# Patient Record
Sex: Female | Born: 1990 | Race: White | Hispanic: No | Marital: Single | Smoking: Never smoker
Health system: Southern US, Community
[De-identification: ages and names within clinical notes are randomized; demographics above are authoritative.]

## PROBLEM LIST (undated history)

## (undated) DIAGNOSIS — R413 Other amnesia: Secondary | ICD-10-CM

## (undated) DIAGNOSIS — G479 Sleep disorder, unspecified: Secondary | ICD-10-CM

## (undated) DIAGNOSIS — R5383 Other fatigue: Secondary | ICD-10-CM

## (undated) DIAGNOSIS — F419 Anxiety disorder, unspecified: Secondary | ICD-10-CM

## (undated) DIAGNOSIS — F909 Attention-deficit hyperactivity disorder, unspecified type: Secondary | ICD-10-CM

## (undated) DIAGNOSIS — K589 Irritable bowel syndrome without diarrhea: Secondary | ICD-10-CM

## (undated) HISTORY — DX: Attention-deficit hyperactivity disorder, unspecified type: F90.9

## (undated) HISTORY — DX: Irritable bowel syndrome, unspecified: K58.9

## (undated) HISTORY — PX: ARM WOUND REPAIR / CLOSURE: SUR1141

## (undated) HISTORY — DX: Other amnesia: R41.3

## (undated) HISTORY — DX: Sleep disorder, unspecified: G47.9

## (undated) HISTORY — DX: Anxiety disorder, unspecified: F41.9

## (undated) HISTORY — DX: Other fatigue: R53.83

---
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
## 2011-06-08 ENCOUNTER — Encounter: Payer: Self-pay | Admitting: Pulmonary Disease

## 2011-06-09 ENCOUNTER — Encounter: Payer: Self-pay | Admitting: Pulmonary Disease

## 2011-06-09 ENCOUNTER — Ambulatory Visit (INDEPENDENT_AMBULATORY_CARE_PROVIDER_SITE_OTHER): Payer: BC Managed Care – PPO | Admitting: Pulmonary Disease

## 2011-06-09 DIAGNOSIS — G4710 Hypersomnia, unspecified: Secondary | ICD-10-CM

## 2011-06-09 DIAGNOSIS — F518 Other sleep disorders not due to a substance or known physiological condition: Secondary | ICD-10-CM

## 2011-06-09 DIAGNOSIS — Z72821 Inadequate sleep hygiene: Secondary | ICD-10-CM
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
## 2011-06-09 NOTE — Patient Instructions (Addendum)
Will schedule for nighttime and daytime sleep study to evaluate for a sleep disorder. You will need to stop lexapro and adderall 2 weeks prior to your sleep study You will need to fill out sleep logs for 2 weeks leading up to your sleep study. Try and start your day no later than 10am if possible, and avoid napping as much as you can  Will arrange followup once your sleep study is done.
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
## 2011-06-09 NOTE — Assessment & Plan Note (Signed)
The patient has very poor sleep hygiene by smoking marijuana before bedtime, also consuming unknown quantities of alcohol, sleeping until the afternoon, and also taking caffeine pills and Adderall during the day to stay awake.  I have reviewed good sleep hygiene with Nicole Decker, and have asked Nicole Decker to try and stay away from recreational drugs as much as possible.

## 2011-06-18 ENCOUNTER — Telehealth: Payer: Self-pay | Admitting: Pulmonary Disease

## 2011-06-18 NOTE — Telephone Encounter (Signed)
I spoke with pt and she states she has her anti anxiety medication and her adderall and as a side effect it has been making her really sleepy and is tried all the time. Pt states she has been trying everything that Dr. Shelle Iron recommended to her. Pt is not wanting to have the sleep study done since she can't comply with all of KC recs from her OV on 06/09/11. Pt is scheduled for 2 sleep studies 1 on 06/30/11 and the second on 07/01/11. Please advise Dr. Shelle Iron, thanks

## 2011-06-18 NOTE — Telephone Encounter (Signed)
I spoke with pt and made her aware of KC recs. She states she has stopped her medications as KC requested and is going to have the sleep study done. Pt states she is doing her best trying to get on a sleep schedule but is just tired all the time. Pt needed nothing further

## 2011-06-18 NOTE — Telephone Encounter (Signed)
Let her know that I suspect her sleep habits and lifestyle are the primary issues here, but I cannot exclude narcolepsy.  She really needs to have the sleep studies done, and has to be off her antidepressant and adderall to do them.  I don't know what else to say, except she has to work on her lifestyle changes we discussed and need to try and stay off those medications to have the sleep studies done.

## 2011-06-30 ENCOUNTER — Ambulatory Visit (HOSPITAL_BASED_OUTPATIENT_CLINIC_OR_DEPARTMENT_OTHER): Payer: BC Managed Care – PPO | Attending: Pulmonary Disease | Admitting: Radiology

## 2011-06-30 VITALS — Ht 67.0 in | Wt 125.0 lb

## 2011-06-30 DIAGNOSIS — G4710 Hypersomnia, unspecified: Secondary | ICD-10-CM | POA: Insufficient documentation

## 2011-06-30 DIAGNOSIS — G4733 Obstructive sleep apnea (adult) (pediatric): Secondary | ICD-10-CM

## 2011-06-30 DIAGNOSIS — Z79899 Other long term (current) drug therapy: Secondary | ICD-10-CM | POA: Insufficient documentation

## 2011-06-30 DIAGNOSIS — G479 Sleep disorder, unspecified: Secondary | ICD-10-CM | POA: Insufficient documentation

## 2011-07-01 ENCOUNTER — Ambulatory Visit (HOSPITAL_BASED_OUTPATIENT_CLINIC_OR_DEPARTMENT_OTHER): Payer: BC Managed Care – PPO | Attending: Pulmonary Disease | Admitting: Radiology

## 2011-07-01 DIAGNOSIS — G479 Sleep disorder, unspecified: Secondary | ICD-10-CM

## 2011-07-01 DIAGNOSIS — G4710 Hypersomnia, unspecified: Secondary | ICD-10-CM

## 2011-07-12 DIAGNOSIS — G479 Sleep disorder, unspecified: Secondary | ICD-10-CM

## 2011-07-13 NOTE — Procedures (Signed)
NAMEKIMBER, Nicole Decker               ACCOUNT NO.:  [id number]  MEDICAL RECORD NO.:  [id number]          PATIENT TYPE:  OUT  LOCATION:  SLEEP CENTER                 FACILITY:  [facility]  PHYSICIAN:  Barbaraann Share, MD,FCCPDATE OF BIRTH:  12-01-89  DATE OF STUDY:  07/01/2011                         MULTIPLE SLEEP LATENCY TEST  REFERRING PHYSICIAN:  Barbaraann Share, MD,FCCP  INDICATION FOR STUDY:  Other sleep disturbance.  EPWORTH SLEEPINESS SCORE:  19.  BMI:  MEDICATIONS:  NAP 1:  Sleep onset latency 17 minutes. REM onset latency N/A.  NAP 2:  Sleep onset latency 15.5 minutes. REM onset N/A.  NAP 3:  Sleep onset latency 6.0 minutes. REM onset latency 11.0 minutes.  NAP 4:  Sleep onset latency 13.0 minutes. REM onset latency N/A.  NAP 5: 1. Sleep onset latency 1.5 minutes. REM onset latency N/A.   MEAN SLEEP LATENCY:  10.6 minutes.  NUMBER OF REM EPISODES:  1  COMMENTS: 1. The patient was asked to discontinue her antidepressant medications     2 weeks prior to this study. Her sleep logs indicate that she was     not taking this medication. 2. Sleep logs 2 weeks prior to the study show adequate sleep quantity     with reasonable sleep hygiene other then sleeping in to the late     morning, and taking naps during the afternoons. 3. NPSG the night before her MSLT shows no sleep-disordered breathing,     or abnormality that could lead to daytime sleepiness.  IMPRESSIONS-RECOMMENDATIONS:  MSLT shows borderline objective daytime sleepiness with a mean sleep latency of 10.6 minutes.  The patient only had 1 sleep onset REM period, and therefore unlikely is consistent with narcolepsy.  More than likely her sleepiness issue is related to her sleep hygiene, however, I cannot exclude the possibility of idiopathic daytime hypersomnia.  Clinical correlation is suggested.     Barbaraann Share, MD,FCCP Diplomate, American Board of Sleep Medicine Electronically  Signed    [facility]  D:  07/12/2011 15:37:57  T:  07/13/2011 00:12:45  Job:  [id number]

## 2011-07-13 NOTE — Procedures (Signed)
NAME:  Nicole Decker, Nicole Decker               ACCOUNT NO.:  [id number]  MEDICAL RECORD NO.:  [id number]          PATIENT TYPE:  OUT  LOCATION:  SLEEP CENTER                 FACILITY:  [facility]  PHYSICIAN:  Barbaraann Share, MD,FCCPDATE OF BIRTH:  1990-10-[birth year]  DATE OF STUDY:  06/30/2011                           NOCTURNAL POLYSOMNOGRAM  REFERRING PHYSICIAN:  Barbaraann Share, MD,FCCP  LOCATION:  Sleep Lab.  REFERRING PHYSICIAN:  Barbaraann Share, MD, FCCP  INDICATION FOR STUDY:  Other sleep disturbance.  EPWORTH SLEEPINESS SCORE:  19.  MEDICATIONS:  SLEEP ARCHITECTURE:  The patient had a total sleep time of 291 minutes with very little slow-wave sleep and only 77 minutes of REM.  Sleep onset latency was prolonged at 149 minutes and REM onset was fairly rapid at 43 minutes.  Sleep efficiency was poor at 65%.  RESPIRATORY DATA:  The patient was found to have 3 apneas and only 1 obstructive hypopnea, giving her an apnea-hypopnea index of 0.8 events per hour.  The events only occurred in the supine position and there was mild snoring noted throughout.  OXYGEN DATA:  There was O2 desaturation as low as 94% with the patient's obstructive events.  CARDIAC DATA:  No clinically significant arrhythmias were noted.  MOVEMENT-PARASOMNIA:  The patient was found to have no significant periodic limb movements, and no behavioral abnormalities.  There was also no abnormal EEG activity.  IMPRESSIONS-RECOMMENDATIONS:  Small numbers of obstructive events, which do not meet the AHI criteria for the obstructive sleep apnea syndrome. If the patient has significant daytime sleepiness that remains unexplained by this study, clinical correlation is suggested.     Barbaraann Share, MD,FCCP Diplomate, American Board of Sleep Medicine Electronically Signed    [facility]  D:  07/12/2011 15:04:59  T:  07/13/2011 00:03:09  Job:  [id number]

## 2011-07-15 ENCOUNTER — Telehealth: Payer: Self-pay | Admitting: *Deleted

## 2011-07-15 ENCOUNTER — Telehealth: Payer: Self-pay | Admitting: Pulmonary Disease

## 2011-07-15 NOTE — Telephone Encounter (Signed)
Per KC, pt needs ov with him to discuss sleep study results. ATC pt at home # (which is also her cell#) NA and No option to leave message.  WCB.

## 2011-07-15 NOTE — Telephone Encounter (Signed)
Pt scheduled for follow up with [facility] 07/24/11 ok per Megan R.

## 2011-07-15 NOTE — Telephone Encounter (Signed)
Pt scheduled 07/24/11 @ 4:30 with KC. Okay per Aundra Millet, pt aware.

## 2011-07-24 ENCOUNTER — Ambulatory Visit (INDEPENDENT_AMBULATORY_CARE_PROVIDER_SITE_OTHER): Payer: BC Managed Care – PPO | Admitting: Pulmonary Disease

## 2011-07-24 ENCOUNTER — Encounter: Payer: Self-pay | Admitting: Pulmonary Disease

## 2011-07-24 ENCOUNTER — Encounter: Payer: Self-pay | Admitting: *Deleted

## 2011-07-24 DIAGNOSIS — F518 Other sleep disorders not due to a substance or known physiological condition: Secondary | ICD-10-CM

## 2011-07-24 DIAGNOSIS — Z72821 Inadequate sleep hygiene: Secondary | ICD-10-CM

## 2011-07-24 DIAGNOSIS — G4710 Hypersomnia, unspecified: Secondary | ICD-10-CM

## 2011-07-24 MED ORDER — TRAZODONE HCL 50 MG PO TABS: 50.0000 mg | ORAL_TABLET | Freq: Every day | ORAL | Status: AC

## 2011-07-24 NOTE — Patient Instructions (Signed)
Will try you on trazodone 50mg  about one hour before bedtime each night Avoid smoking marijuana for the next 4 weeks Try and get up each am between 8-10am, and therefore in bed by 2am at latest. No napping during day if you can avoid. Please call me in 3-4 weeks with how things are going and we can discuss further.

## 2011-07-24 NOTE — Assessment & Plan Note (Signed)
The patient's sleep evaluation shows no evidence for a nocturnal sleep disorder, and is not consistent with narcolepsy.  I cannot totally exclude the possibility of idiopathic hypersomnia, but her mean sleep latency is much higher than expected for that particular disease process.  I suspect the majority of this is due to her hectic lifestyle and poor sleep hygiene.  Also complicating this is her recreational drug use.  She is trying to go to school and work, continues to sleep into the late morning and early afternoon, still takes naps, is taking Adderall as needed to stay awake during the day, and smokes marijuana nightly before going to bed.  She is still having frequent awakenings during the night of unknown origin, and anxiety may be playing a role in this.  This of course leads to sleep fragmentation and nonrestorative sleep.  I would like to try her on trazodone at bedtime to consolidate her sleep as much is possible, and I have asked her to stop smoking marijuana at bedtime until we can sort through all of this.  She is not sure that she is willing to do this.  I have also asked her to work on her sleep hygiene, and to try and start her day earlier in the mornings and avoid napping.

## 2011-07-24 NOTE — Progress Notes (Signed)
  Subjective:    Patient ID: Nicole Decker, female    DOB: [birth year], 21 y.o.   MRN: [id number]  HPI The patient comes in today for followup after her recent sleep evaluation.  She underwent nocturnal polysomnography, and had no evidence for sleep-disordered breathing, arrhythmias, movement disorder of sleep, parasomnias, or seizures.  She then underwent a multiple sleep latency test which showed a mean sleep latency of greater than 10 minutes, and only one REM period noted throughout the naps.  I have reviewed the study with her in detail, and answered all of her questions.   Review of Systems  Constitutional: Negative for fever and unexpected weight change.  HENT: Negative for ear pain, nosebleeds, congestion, sore throat, rhinorrhea, sneezing, trouble swallowing, dental problem, postnasal drip and sinus pressure.   Eyes: Negative for redness and itching.  Respiratory: Negative for cough, chest tightness, shortness of breath and wheezing.   Cardiovascular: Negative for palpitations and leg swelling.  Gastrointestinal: Negative for nausea and vomiting.  Genitourinary: Negative for dysuria.  Musculoskeletal: Negative for joint swelling.  Skin: Negative for rash.  Neurological: Negative for headaches.  Hematological: Does not bruise/bleed easily.  Psychiatric/Behavioral: Negative for dysphoric mood. The patient is not nervous/anxious.        Objective:   Physical Exam Well-developed female in no acute distress Nose without purulence or discharge noted Lower extremities without edema, no cyanosis Alert and oriented, moves all 4 extremities.  Does not appear to be overly sleepy.       Assessment & Plan:

## 2011-09-19 ENCOUNTER — Other Ambulatory Visit: Payer: Self-pay | Admitting: Pulmonary Disease

## 2011-12-08 ENCOUNTER — Other Ambulatory Visit: Payer: Self-pay | Admitting: Pulmonary Disease

## 2012-01-04 ENCOUNTER — Telehealth: Payer: Self-pay | Admitting: Pulmonary Disease
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
## 2012-01-04 NOTE — Telephone Encounter (Signed)
Let her know that a letter would not be very helpful to her.  The reason for her sleepiness is her own lifestyle and habits, and I am not sure that would sit well with the judge.  Remember, the sleep studies did not show sleep apnea, narcolepsy, or any other sleep disorder.   If she needs something for evaluation documenting that she did have an evaluation for sleepiness, can get my last note to give to the judge.  However, I don't think would be very smart on her part because it does not document a sleep disorder.  Would read and think about before doing this.

## 2012-01-05 NOTE — Telephone Encounter (Signed)
Pt advised an OV not mailed to the pt. Nicole Decker, CMA

## 2012-07-13 HISTORY — PX: ESOPHAGOGASTRODUODENOSCOPY: SHX1529

## 2012-07-13 HISTORY — PX: COLONOSCOPY: SHX174

## 2012-09-20 ENCOUNTER — Encounter: Payer: Self-pay | Admitting: Gastroenterology

## 2012-10-04 ENCOUNTER — Encounter: Payer: Self-pay | Admitting: Gastroenterology

## 2012-10-04 ENCOUNTER — Ambulatory Visit (INDEPENDENT_AMBULATORY_CARE_PROVIDER_SITE_OTHER): Payer: BC Managed Care – PPO | Admitting: Gastroenterology

## 2012-10-04 ENCOUNTER — Telehealth: Payer: Self-pay | Admitting: *Deleted

## 2012-10-04 ENCOUNTER — Other Ambulatory Visit (INDEPENDENT_AMBULATORY_CARE_PROVIDER_SITE_OTHER): Payer: BC Managed Care – PPO

## 2012-10-04 VITALS — BP 94/60 | HR 94 | Ht 65.5 in | Wt 113.5 lb

## 2012-10-04 DIAGNOSIS — K589 Irritable bowel syndrome without diarrhea: Secondary | ICD-10-CM

## 2012-10-04 DIAGNOSIS — K219 Gastro-esophageal reflux disease without esophagitis: Secondary | ICD-10-CM

## 2012-10-04 DIAGNOSIS — R110 Nausea: Secondary | ICD-10-CM

## 2012-10-04 DIAGNOSIS — R1011 Right upper quadrant pain: Secondary | ICD-10-CM

## 2012-10-04 LAB — HEPATIC FUNCTION PANEL
Alkaline Phosphatase: 29 U/L — ABNORMAL LOW (ref 39–117)
Bilirubin, Direct: 0.1 mg/dL (ref 0.0–0.3)
Total Bilirubin: 0.5 mg/dL (ref 0.3–1.2)

## 2012-10-04 LAB — TSH: TSH: 3 u[IU]/mL (ref 0.35–5.50)

## 2012-10-04 LAB — COMPREHENSIVE METABOLIC PANEL
Albumin: 3.9 g/dL (ref 3.5–5.2)
Alkaline Phosphatase: 29 U/L — ABNORMAL LOW (ref 39–117)
BUN: 12 mg/dL (ref 6–23)
CO2: 27 mEq/L (ref 19–32)
GFR: 91.72 mL/min (ref >60.00)
Glucose, Bld: 117 mg/dL — ABNORMAL HIGH (ref 70–99)
Potassium: 4.2 mEq/L (ref 3.5–5.1)
Sodium: 140 mEq/L (ref 135–145)
Total Protein: 7.2 g/dL (ref 6.0–8.3)

## 2012-10-04 LAB — CBC WITH DIFFERENTIAL/PLATELET
Basophils Relative: 0.2 % (ref 0.0–3.0)
Eosinophils Relative: 2.5 % (ref 0.0–5.0)
HCT: 42.7 % (ref 36.0–46.0)
Lymphs Abs: 2.1 10*3/uL (ref 0.7–4.0)
MCV: 95.7 fl (ref 78.0–100.0)
Monocytes Absolute: 0.4 10*3/uL (ref 0.1–1.0)
Monocytes Relative: 6.8 % (ref 3.0–12.0)
Neutrophils Relative %: 55.5 % (ref 43.0–77.0)
RBC: 4.46 Mil/uL (ref 3.87–5.11)
WBC: 6.1 10*3/uL (ref 4.5–10.5)

## 2012-10-04 LAB — IBC PANEL: Saturation Ratios: 14 % — ABNORMAL LOW (ref 20.0–50.0)

## 2012-10-04 LAB — FOLATE: Folate: 13.9 ng/mL (ref >5.9)

## 2012-10-04 LAB — FERRITIN: Ferritin: 42.9 ng/mL (ref 10.0–291.0)

## 2012-10-04 MED ORDER — CILIDINIUM-CHLORDIAZEPOXIDE 2.5-5 MG PO CAPS: 1.0000 | ORAL_CAPSULE | Freq: Three times a day (TID) | ORAL | Status: DC | PRN

## 2012-10-04 MED ORDER — CELECOXIB 200 MG PO CAPS: 200.0000 mg | ORAL_CAPSULE | Freq: Two times a day (BID) | ORAL | Status: DC

## 2012-10-04 MED ORDER — PANTOPRAZOLE SODIUM 40 MG PO TBEC: 40.0000 mg | DELAYED_RELEASE_TABLET | Freq: Every day | ORAL | Status: DC

## 2012-10-04 NOTE — Patient Instructions (Addendum)
It has been recommended to you by your physician that you have a(n) colonoscopy and endoscopy completed. Per your request, we did not schedule the procedure(s) today. Please contact our office at [phone] should you decide to have the procedure completed.  You have been scheduled for an abdominal ultrasound at [facility] Radiology (1st floor of hospital) on 10-06-2012 at 8 AM. Please arrive 15 minutes prior to your appointment for registration. Make certain not to have anything to eat or drink 6 hours prior to your appointment. Should you need to reschedule your appointment, please contact radiology at [phone]. This test typically takes about 30 minutes to perform.   Your physician has requested that you go to the basement for lab work before leaving today.   Celebrex 200 mg was sent to your pharmacy, please take twice daily.   Librax was sent to your pharmacy, please take as directed.  Protonix was refilled.   Please follow FOD-Map diet given today. ____________________________________________________________________________________________________________________  Irritable Bowel Syndrome Irritable Bowel Syndrome (IBS) is caused by a disturbance of normal bowel function. Other terms used are spastic colon, mucous colitis, and irritable colon. It does not require surgery, nor does it lead to cancer. There is no cure for IBS. But with proper diet, stress reduction, and medication, you will find that your problems (symptoms) will gradually disappear or improve. IBS is a common digestive disorder. It usually appears in late adolescence or early adulthood. Women develop it twice as often as men. CAUSES  After food has been digested and absorbed in the small intestine, waste material is moved into the colon (large intestine). In the colon, water and salts are absorbed from the undigested products coming from the small intestine. The remaining residue, or fecal material, is held for  elimination. Under normal circumstances, gentle, rhythmic contractions on the bowel walls push the fecal material along the colon towards the rectum. In IBS, however, these contractions are irregular and poorly coordinated. The fecal material is either retained too long, resulting in constipation, or expelled too soon, producing diarrhea. SYMPTOMS  The most common symptom of IBS is pain. It is typically in the lower left side of the belly (abdomen). But it may occur anywhere in the abdomen. It can be felt as heartburn, backache, or even as a dull pain in the arms or shoulders. The pain comes from excessive bowel-muscle spasms and from the buildup of gas and fecal material in the colon. This pain:  Can range from sharp belly (abdominal) cramps to a dull, continuous ache.  Usually worsens soon after eating.  Is typically relieved by having a bowel movement or passing gas. Abdominal pain is usually accompanied by constipation. But it may also produce diarrhea. The diarrhea typically occurs right after a meal or upon arising in the morning. The stools are typically soft and watery. They are often flecked with secretions (mucus). Other symptoms of IBS include:  Bloating.  Loss of appetite.  Heartburn.  Feeling sick to your stomach (nausea).  Belching  Vomiting  Gas. IBS may also cause a number of symptoms that are unrelated to the digestive system:  Fatigue.  Headaches.  Anxiety  Shortness of breath  Difficulty in concentrating.  Dizziness. These symptoms tend to come and go. DIAGNOSIS  The symptoms of IBS closely mimic the symptoms of other, more serious digestive disorders. So your caregiver may wish to perform a variety of additional tests to exclude these disorders. He/she wants to be certain of learning what is wrong (diagnosis).  The nature and purpose of each test will be explained to you. TREATMENT A number of medications are available to help correct bowel function and/or  relieve bowel spasms and abdominal pain. Among the drugs available are:  Mild, non-irritating laxatives for severe constipation and to help restore normal bowel habits.  Specific anti-diarrheal medications to treat severe or prolonged diarrhea.  Anti-spasmodic agents to relieve intestinal cramps.  Your caregiver may also decide to treat you with a mild tranquilizer or sedative during unusually stressful periods in your life. The important thing to remember is that if any drug is prescribed for you, make sure that you take it exactly as directed. Make sure that your caregiver knows how well it worked for you. HOME CARE INSTRUCTIONS   Avoid foods that are high in fat or oils. Some examples WUJ:WJXBJ cream, butter, frankfurters, sausage, and other fatty meats.  Avoid foods that have a laxative effect, such as fruit, fruit juice, and dairy products.  Cut out carbonated drinks, chewing gum, and "gassy" foods, such as beans and cabbage. This may help relieve bloating and belching.  Bran taken with plenty of liquids may help relieve constipation.  Keep track of what foods seem to trigger your symptoms.  Avoid emotionally charged situations or circumstances that produce anxiety.  Start or continue exercising.  Get plenty of rest and sleep. MAKE SURE YOU:   Understand these instructions.  Will watch your condition.  Will get help right away if you are not doing well or get worse. Document Released: 06/29/2005 Document Revised: 09/21/2011 Document Reviewed: 02/17/2008 [facility] Patient Information 2013 [place], [place].   Information below on Irritable Bowel Syndrome. __________________________________________________________________________________                                               We are excited to introduce MyChart, a new best-in-class service that provides you online access to important information in your electronic medical record. We want to make it easier for you to  view your health information - all in one secure location - when and where you need it. We expect MyChart will enhance the quality of care and service we provide.  When you register for MyChart, you can:    View your test results.    Request appointments and receive appointment reminders via email.    Request medication renewals.    View your medical history, allergies, medications and immunizations.    Communicate with your physician's office through a password-protected site.    Conveniently print information such as your medication lists.  To find out if MyChart is right for you, please talk to a member of our clinical staff today. We will gladly answer your questions about this free health and wellness tool.  If you are age 69 or older and want a member of your family to have access to your record, you must provide written consent by completing a proxy form available at our office. Please speak to our clinical staff about guidelines regarding accounts for patients younger than age 46.  As you activate your MyChart account and need any technical assistance, please call the MyChart technical support line at [phone]) or email your question to [email or web address]. If you email your question(s), please include your name, a return phone number and the best time to reach you.  If you have non-urgent health-related questions, you  can send a message to our office through MyChart at [email or web address]. If you have a medical emergency, call 911.  Thank you for using MyChart as your new health and wellness resource!   MyChart licensed from [employer],  1610-9604. Patents Pending.

## 2012-10-04 NOTE — Progress Notes (Signed)
History of Present Illness:  This is a 22 year old Caucasian female with a long history of IBS going back to childhood with chronic dyspepsia, alternating diarrhea and constipation, crampy abdominal pain, dizziness, chronic depression, chronic gas and bloating, and a history of periodic flushing with stress , but no skin rashes, cardiac palpitations, or asthma..  She been on antidepressants and PPI medications without improvement.  She also takes Adderall 20 mg periodically for ADHD.  There is a  question of lactose intolerance, but the patient otherwise denies any specific food intolerances, and there is no history of celiac disease in her family.  The patient is accompanied by her mother for her exam and interview.  She complains mostly of lower abdominal cramps followed by very loose bowel movements .rectal bleeding or melena, but occasional bouts of extreme constipation.  She complains of early morning nausea ,but has been tested for pregnancy on several occasions and is not pregnant.  She also complains of extreme vertigo.  She has a lot of abdominal gas and bloating and periodic reflux symptoms but denies severe GERD, or dysphagia.  In the last month she's had a right upper quadrant musculoskeletal type pain without specific hepatobiliary complaints.Has not had previous GI evaluations such as endoscopic procedures or radiographs or specific GI blood work.  She specifically denies any significant anorexia or weight loss.  I have reviewed this patient's present history, medical and surgical past history, allergies and medications.     ROS:   All systems were reviewed and are negative unless otherwise stated in the HPI.  She does complain of severe insomnia but denies nay psychotic symptoms.  Recent gynecologic examination as recorded and was unremarkable.  The patient denies abuse of NSAIDs or cigarettes.    Physical Exam: At pressure 94 was 60, pulse 94 and regular, and weight 113 with a BMI of 18.59.   I cannot appreciate stigmata of chronic liver disease or any specific skin rashes or lymphadenopathy. General well developed well nourished patient in no acute distress, appearing their stated age Eyes PERRLA, no icterus, fundoscopic exam per opthamologist Skin no lesions noted Neck supple, no adenopathy, no thyroid enlargement, no tenderness Chest clear to percussion and auscultation... tenderness of the right anterior costal rib areas. Heart no significant murmurs, gallops or rubs noted Abdomen no hepatosplenomegaly masses or tenderness, BS normal there is no significant abdominal bloating or tenderness.  There is no succussion splash noted..  Extremities no acute joint lesions, edema, phlebitis or evidence of cellulitis. Neurologic patient oriented x 3, cranial nerves intact, no focal neurologic deficits noted. Psychological mental status normal and normal affect.  Assessment and plan: Rather classic IBS and 22 year old patient with a strong family history of IBS.  I placed her on a FOD-MAP diet for IBS, Librax twice a day, have scheduled for GI evaluation including colonoscopy and endoscopy.  I think her right upper quadrant pain is musculoskeletal  costochondritis, and I placed her on Celebrex 200 mg a day, but have ordered upper abdominal ultrasound exam at her request.  Standard labs also ordered for review.  There may be an element of lactose intolerance, and we will be sure to exclude celiac disease.  Patient readily admits distress causes most of her GI difficulties, and she may need psychological evaluation and referral.  She does have a family history of colon polyps.  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  She relates she  has not used alcohol in over one month.  Please send a copy this report to Julio Sicks, RN at [facility].  I also have asked patient to continue  her Protonix 40 mg a day for her GERD.

## 2012-10-04 NOTE — Telephone Encounter (Signed)
Patients Celebrex was approved.  Case number is [id number]  Pharmacy notified

## 2012-10-04 NOTE — Telephone Encounter (Signed)
Via fax from [facility] prior [facility] Harvey is needed for patients Celebrex.  I called [phone].   I spoke with Vickie at [employer].

## 2012-10-05 LAB — CELIAC PANEL 10
Endomysial Screen: NEGATIVE
Gliadin IgA: 3.7 U/mL (ref <20)
Gliadin IgG: 6.3 U/mL (ref <20)
IgA: 173 mg/dL (ref 69–380)
Tissue Transglut Ab: 5.3 U/mL (ref <20)
Tissue Transglutaminase Ab, IgA: 2.7 U/mL (ref <20)

## 2012-10-06 ENCOUNTER — Ambulatory Visit (HOSPITAL_COMMUNITY): Admission: RE | Admit: 2012-10-06 | Payer: BC Managed Care – PPO | Source: Ambulatory Visit

## 2012-10-06 ENCOUNTER — Telehealth: Payer: Self-pay | Admitting: *Deleted

## 2012-10-06 ENCOUNTER — Telehealth: Payer: Self-pay | Admitting: Gastroenterology

## 2012-10-06 DIAGNOSIS — R110 Nausea: Secondary | ICD-10-CM

## 2012-10-06 DIAGNOSIS — R1011 Right upper quadrant pain: Secondary | ICD-10-CM

## 2012-10-06 NOTE — Telephone Encounter (Signed)
Patient called back and said that she has colon/endo scheduled for 10-12-2012.  I told patient prep instructions for her Ultrasound(advised patient that its the same instructions as previous)  Patient verbalized understanding.

## 2012-10-06 NOTE — Telephone Encounter (Signed)
You have been scheduled for an abdominal ultrasound at [facility] Radiology (1st floor of hospital) on 11-02-2012 at 9:30 AM. Please arrive 15 minutes prior to your appointment for registration. Make certain not to have anything to eat or drink 6 hours prior to your appointment. Should you need to reschedule your appointment, please contact radiology at [phone]. This test typically takes about 30 minutes to perform. ______________________________________________________________________________________________________________________  Left message for patient to call back, no answer on cell phone.

## 2012-10-07 ENCOUNTER — Encounter: Payer: Self-pay | Admitting: Gastroenterology

## 2012-10-07 ENCOUNTER — Ambulatory Visit (AMBULATORY_SURGERY_CENTER): Payer: BC Managed Care – PPO | Admitting: *Deleted

## 2012-10-07 VITALS — Ht 65.0 in | Wt 111.6 lb

## 2012-10-07 DIAGNOSIS — K589 Irritable bowel syndrome without diarrhea: Secondary | ICD-10-CM

## 2012-10-07 DIAGNOSIS — K219 Gastro-esophageal reflux disease without esophagitis: Secondary | ICD-10-CM

## 2012-10-07 DIAGNOSIS — R110 Nausea: Secondary | ICD-10-CM

## 2012-10-07 DIAGNOSIS — R1011 Right upper quadrant pain: Secondary | ICD-10-CM

## 2012-10-07 MED ORDER — MOVIPREP 100 G PO SOLR: ORAL | Status: DC

## 2012-10-12 ENCOUNTER — Encounter: Payer: BC Managed Care – PPO | Admitting: Gastroenterology

## 2012-10-12 ENCOUNTER — Ambulatory Visit (AMBULATORY_SURGERY_CENTER): Payer: BC Managed Care – PPO | Admitting: Gastroenterology

## 2012-10-12 ENCOUNTER — Encounter: Payer: Self-pay | Admitting: Gastroenterology

## 2012-10-12 VITALS — BP 103/76 | HR 78 | Temp 99.4°F | Resp 21 | Ht 65.0 in | Wt 111.0 lb

## 2012-10-12 DIAGNOSIS — K589 Irritable bowel syndrome without diarrhea: Secondary | ICD-10-CM

## 2012-10-12 DIAGNOSIS — R110 Nausea: Secondary | ICD-10-CM

## 2012-10-12 DIAGNOSIS — K319 Disease of stomach and duodenum, unspecified: Secondary | ICD-10-CM

## 2012-10-12 DIAGNOSIS — D1330 Benign neoplasm of unspecified part of small intestine: Secondary | ICD-10-CM

## 2012-10-12 DIAGNOSIS — R1011 Right upper quadrant pain: Secondary | ICD-10-CM

## 2012-10-12 DIAGNOSIS — K3189 Other diseases of stomach and duodenum: Secondary | ICD-10-CM

## 2012-10-12 DIAGNOSIS — R1013 Epigastric pain: Secondary | ICD-10-CM

## 2012-10-12 MED ORDER — SODIUM CHLORIDE 0.9 % IV SOLN: 500.0000 mL | INTRAVENOUS | Status: DC

## 2012-10-12 NOTE — Progress Notes (Signed)
NO EGG OR SOY ALLERGY. EWM 

## 2012-10-12 NOTE — Progress Notes (Signed)
Called to room to assist during endoscopic procedure.  Patient ID and intended procedure confirmed with present staff. Received instructions for my participation in the procedure from the performing physician.  

## 2012-10-12 NOTE — Patient Instructions (Addendum)
Discharge instructions given with verbal understanding. Biopsies taken. Resume previous medications. YOU HAD AN ENDOSCOPIC PROCEDURE TODAY AT THE [facility]: Refer to the procedure report that was given to you for any specific questions about what was found during the examination.  If the procedure report does not answer your questions, please call your gastroenterologist to clarify.  If you requested that your care partner not be given the details of your procedure findings, then the procedure report has been included in a sealed envelope for you to review at your convenience later.  YOU SHOULD EXPECT: Some feelings of bloating in the abdomen. Passage of more gas than usual.  Walking can help get rid of the air that was put into your GI tract during the procedure and reduce the bloating. If you had a lower endoscopy (such as a colonoscopy or flexible sigmoidoscopy) you may notice spotting of blood in your stool or on the toilet paper. If you underwent a bowel prep for your procedure, then you may not have a normal bowel movement for a few days.  DIET: Your first meal following the procedure should be a light meal and then it is ok to progress to your normal diet.  A half-sandwich or bowl of soup is an example of a good first meal.  Heavy or fried foods are harder to digest and may make you feel nauseous or bloated.  Likewise meals heavy in dairy and vegetables can cause extra gas to form and this can also increase the bloating.  Drink plenty of fluids but you should avoid alcoholic beverages for 24 hours.  ACTIVITY: Your care partner should take you home directly after the procedure.  You should plan to take it easy, moving slowly for the rest of the day.  You can resume normal activity the day after the procedure however you should NOT DRIVE or use heavy machinery for 24 hours (because of the sedation medicines used during the test).    SYMPTOMS TO REPORT IMMEDIATELY: A gastroenterologist  can be reached at any hour.  During normal business hours, 8:30 AM to 5:00 PM Monday through Friday, call [phone].  After hours and on weekends, please call the GI answering service at [phone] who will take a message and have the physician on call contact you.   Following lower endoscopy (colonoscopy or flexible sigmoidoscopy):  Excessive amounts of blood in the stool  Significant tenderness or worsening of abdominal pains  Swelling of the abdomen that is new, acute  Fever of 100F or higher  Following upper endoscopy (EGD)  Vomiting of blood or coffee ground material  New chest pain or pain under the shoulder blades  Painful or persistently difficult swallowing  New shortness of breath  Fever of 100F or higher  Black, tarry-looking stools  FOLLOW UP: If any biopsies were taken you will be contacted by phone or by letter within the next 1-3 weeks.  Call your gastroenterologist if you have not heard about the biopsies in 3 weeks.  Our staff will call the home number listed on your records the next business day following your procedure to check on you and address any questions or concerns that you may have at that time regarding the information given to you following your procedure. This is a courtesy call and so if there is no answer at the home number and we have not heard from you through the emergency physician on call, we will assume that you have returned to your   regular daily activities without incident.  SIGNATURES/CONFIDENTIALITY: You and/or your care partner have signed paperwork which will be entered into your electronic medical record.  These signatures attest to the fact that that the information above on your After Visit Summary has been reviewed and is understood.  Full responsibility of the confidentiality of this discharge information lies with you and/or your care-partner. 

## 2012-10-12 NOTE — Op Note (Signed)
 [facility] [address] [place], [address]   ENDOSCOPY PROCEDURE REPORT  PATIENT: Nicole Decker, Nicole Decker  MR#: [id number] BIRTHDATE: [birth year] , 22  yrs. old GENDER: Female ENDOSCOPIST:Haizley Cannella Hale Bogus, MD, [facility] REFERRED BY: Julio Sicks, [employer] PROCEDURE DATE:  10/12/2012 PROCEDURE:   EGD w/ biopsy and EGD w/ biopsy for H.pylori ASA CLASS:    Class II INDICATIONS: Dyspepsia. MEDICATION: There was residual sedation effect present from prior procedure and propofol (Diprivan) 150mg  IV TOPICAL ANESTHETIC:  DESCRIPTION OF PROCEDURE:   After the risks and benefits of the procedure were explained, informed consent was obtained.  The [facility] GIF-H180 [id number]  endoscope was introduced through the mouth  and advanced to the second portion of the duodenum .  The instrument was slowly withdrawn as the mucosa was fully examined.    The upper, middle and distal third of the esophagus were carefully inspected and no abnormalities were noted.  The z-line was well seen at the GEJ.  The endoscope was pushed into the fundus which was normal including a retroflexed view.  The antrum, gastric body, first and second part of the duodenum were unremarkable. Duodenal biopsies done and CLO BX. of antrum done.   Retroflexed views revealed no abnormalities.    The scope was then withdrawn from the patient and the procedure completed.  COMPLICATIONS: There were no complications.   ENDOSCOPIC IMPRESSION: Normal EGD .Marland Kitchen.chronic IBS.Marland KitchenMarland KitchenR/o celiac disease,H.pylori,etc.  RECOMMENDATIONS: 1.  Await pathology results 2.  Continue current meds    _______________________________ eSigned:  Mardella Layman, MD, [facility] 10/12/2012 4:03 PM   standard discharge   PATIENT NAME:  [name] MR#: [id number]

## 2012-10-12 NOTE — Op Note (Signed)
[facility] [address] [place], [address]   COLONOSCOPY PROCEDURE REPORT  PATIENT: Nicole, Decker  MR#: [id number] BIRTHDATE: 05/15/1991 , 22  yrs. old GENDER: Female ENDOSCOPIST: Mardella Layman, MD, [facility] REFERRED BY:  Julio Sicks, [facility] PROCEDURE DATE:  10/12/2012 PROCEDURE:   Colonoscopy, screening ASA CLASS:   Class II INDICATIONS:Average risk patient for colon cancer, unexplained diarrhea, and Abdominal pain. MEDICATIONS: propofol (Diprivan) 300mg  IV  DESCRIPTION OF PROCEDURE:   After the risks and benefits and of the procedure were explained, informed consent was obtained.  A digital rectal exam revealed no abnormalities of the rectum.    The LB PCF-H180AL [id number]  endoscope was introduced through the anus and advanced to the cecum, which was identified by both the appendix and ileocecal valve .  The quality of the prep was excellent, using MoviPrep .  The instrument was then slowly withdrawn as the colon was fully examined.     COLON FINDINGS: A normal appearing cecum, ileocecal valve, and appendiceal orifice were identified.  The ascending, hepatic flexure, transverse, splenic flexure, descending, sigmoid colon and rectum appeared unremarkable.  No polyps or cancers were seen.  I could not intubare the IC valve.   Retroflexed views revealed no abnormalities.     The scope was then withdrawn from the patient and the procedure completed.  COMPLICATIONS: There were no complications. ENDOSCOPIC IMPRESSION: Normal colon ..chronic irritable bowel syndrome....  RECOMMENDATIONS: 1.  Continue current medications 2.  Upper endoscopy will be scheduled   REPEAT EXAM:  cc:  _______________________________ eSigned:  Mardella Layman, MD, [facility] 10/12/2012 3:51 PM     PATIENT NAME:  Nicole Decker MR#: [id number]

## 2012-10-12 NOTE — Progress Notes (Signed)
Patient did not experience any of the following events: a burn prior to discharge; a fall within the facility; wrong site/side/patient/procedure/implant event; or a hospital transfer or hospital admission upon discharge from the facility. (G8907) Patient did not have preoperative order for IV antibiotic SSI prophylaxis. (G8918)  

## 2012-10-13 ENCOUNTER — Telehealth: Payer: Self-pay | Admitting: *Deleted

## 2012-10-13 NOTE — Telephone Encounter (Signed)
  Follow up Call-  Call back number 10/12/2012  Post procedure Call Back phone  # [phone]  Permission to leave phone message Yes     No answer,left message!

## 2012-10-14 ENCOUNTER — Encounter: Payer: Self-pay | Admitting: Gastroenterology

## 2012-10-20 ENCOUNTER — Encounter: Payer: Self-pay | Admitting: Gastroenterology

## 2012-11-02 ENCOUNTER — Ambulatory Visit (HOSPITAL_COMMUNITY)
Admission: RE | Admit: 2012-11-02 | Discharge: 2012-11-02 | Disposition: A | Discharge status: Home / Self Care | Payer: BC Managed Care – PPO | Source: Ambulatory Visit | Attending: Gastroenterology | Admitting: Gastroenterology

## 2012-11-02 DIAGNOSIS — R110 Nausea: Secondary | ICD-10-CM | POA: Insufficient documentation

## 2012-11-02 DIAGNOSIS — R1011 Right upper quadrant pain: Secondary | ICD-10-CM | POA: Insufficient documentation

## 2013-05-18 ENCOUNTER — Other Ambulatory Visit: Payer: Self-pay

## 2013-11-19 IMAGING — US US ABDOMEN COMPLETE
1 series · 14 of 25 positions shown · non-contrast
Comparison: None.

CLINICAL DATA: Right upper quadrant abdominal pain and nausea

COMPLETE ABDOMINAL ULTRASOUND

[Series 1: us abdomen complete · 0.26mm/px · 14 of 82 slices shown]
[im 1/82]
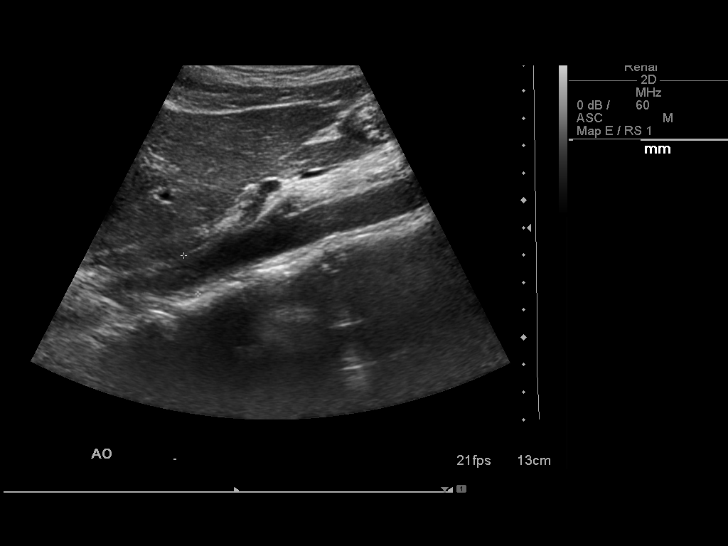
[im 7/82]
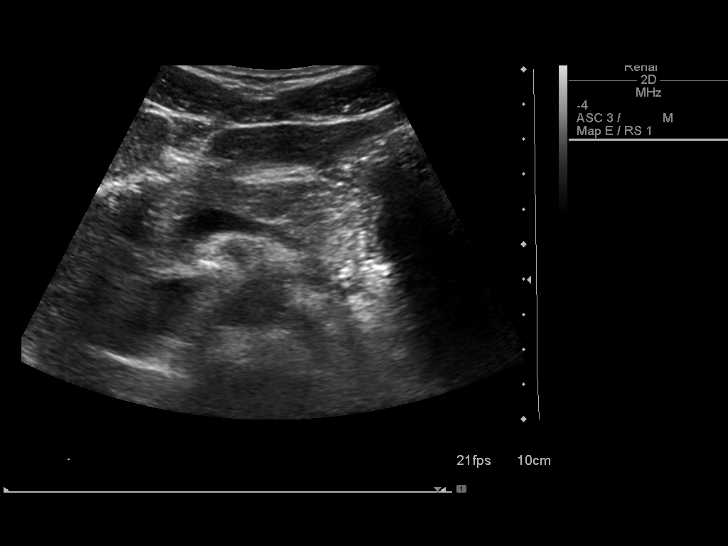
[im 14/82]
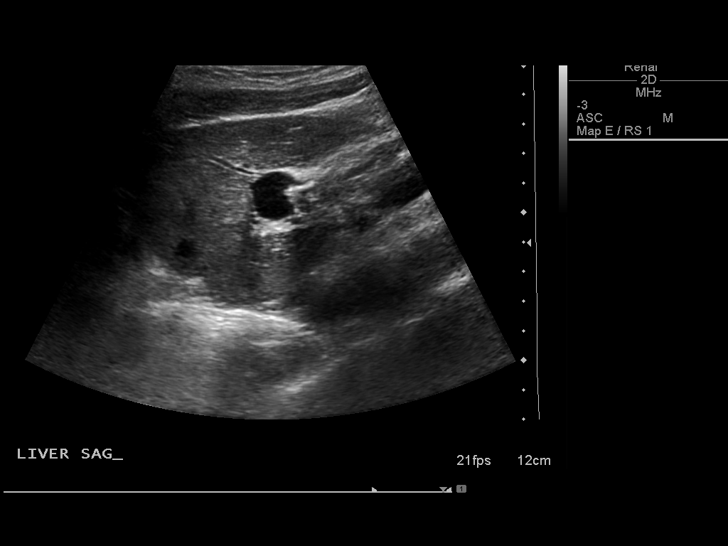
[im 21/82]
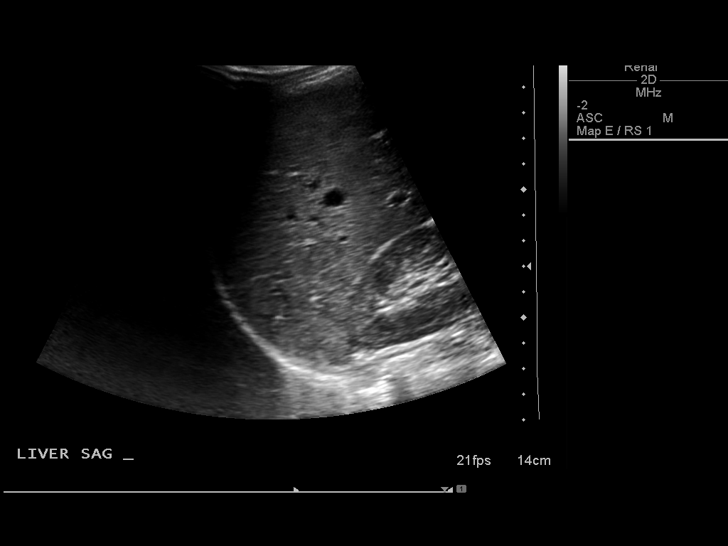
[im 28/82]
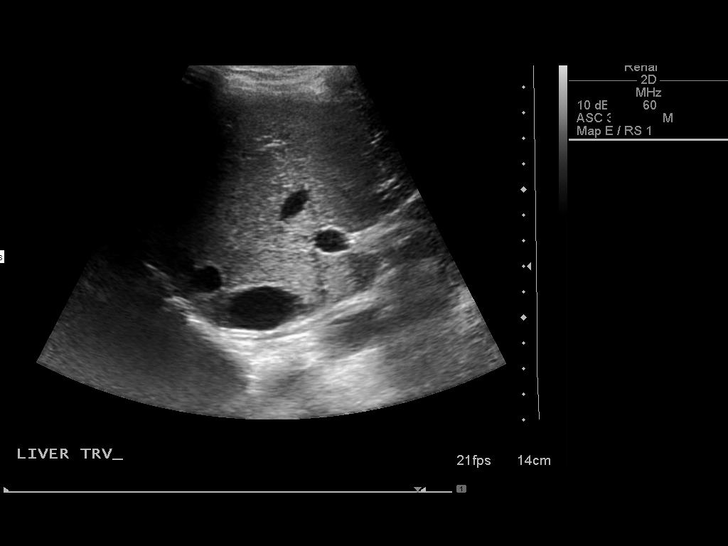
[im 31/82]
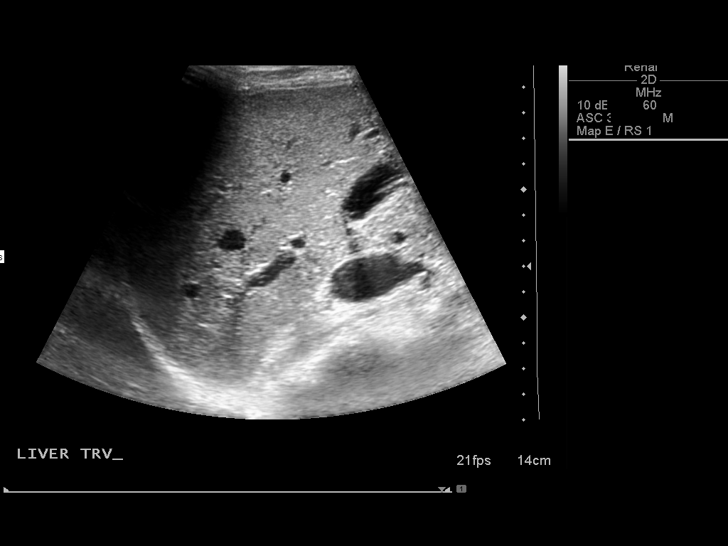
[im 38/82]
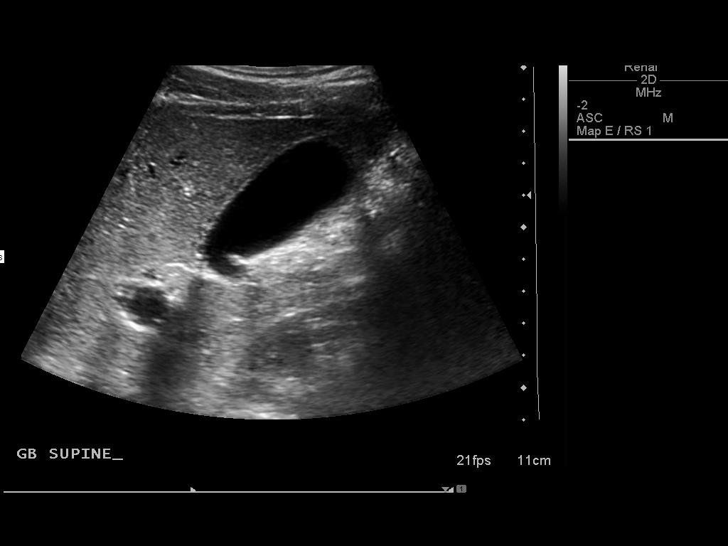
[im 44/82]
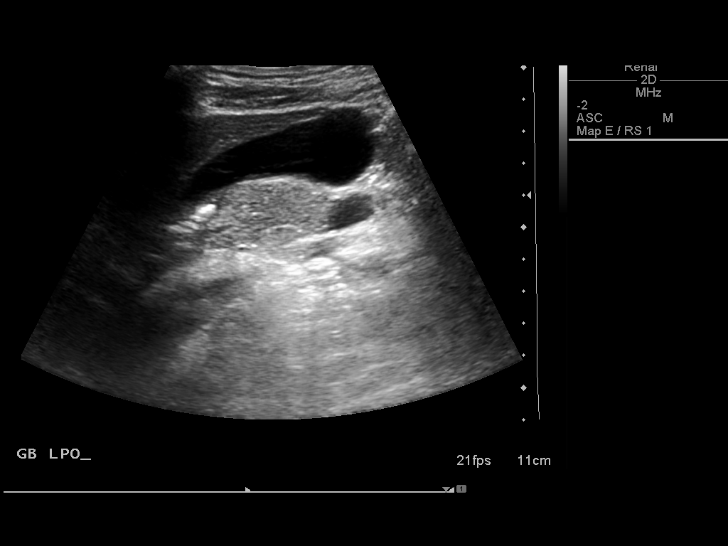
[im 51/82]
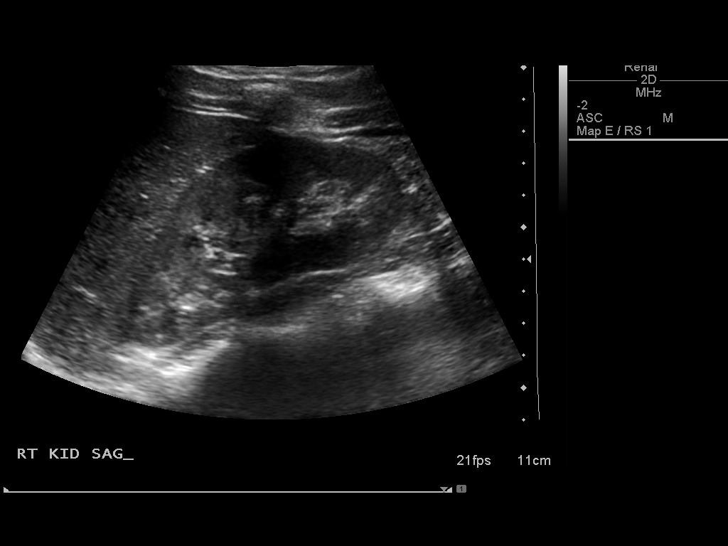
[im 55/82]
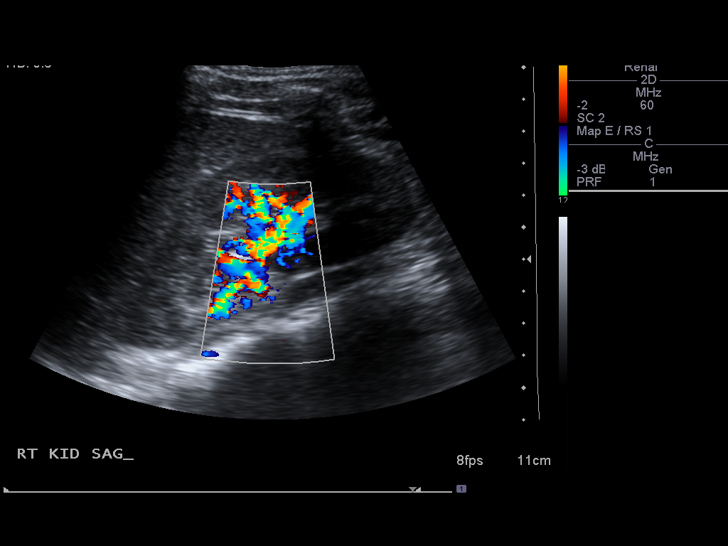
[im 61/82]
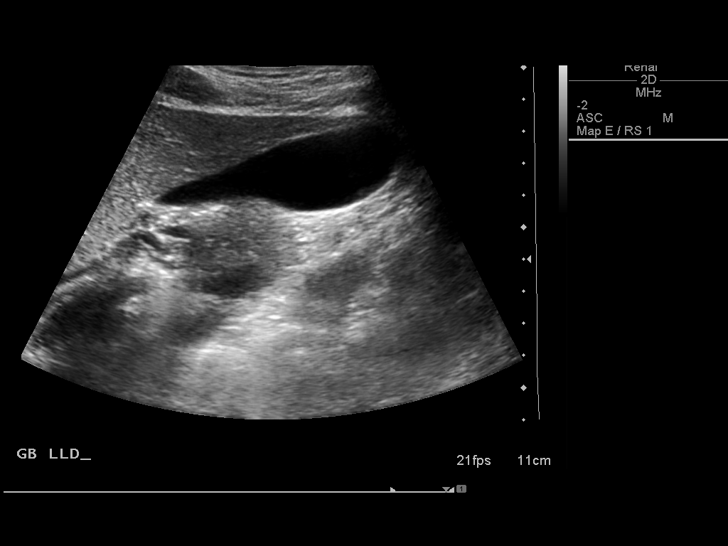
[im 68/82]
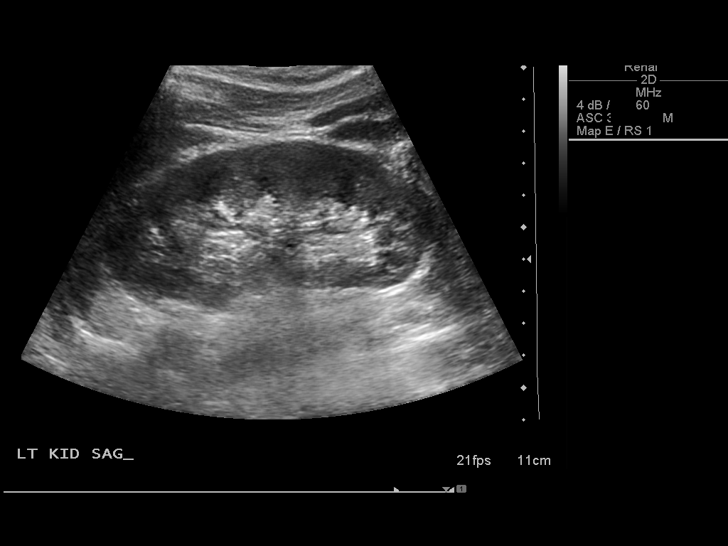
[im 75/82]
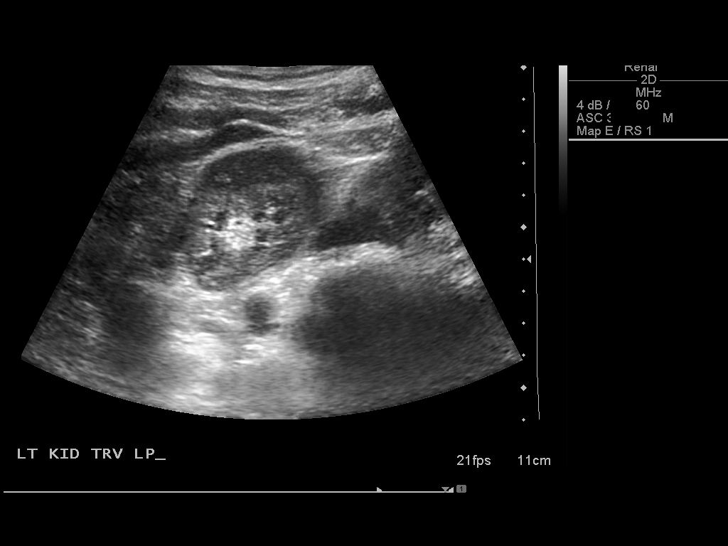
[im 82/82]
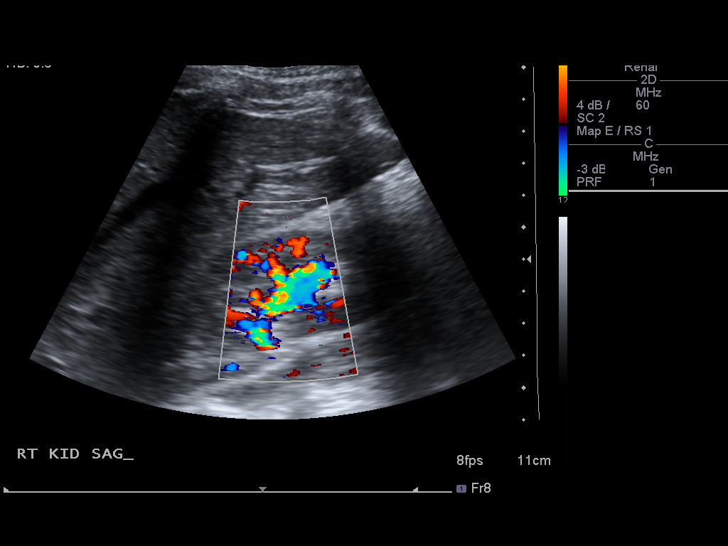

[14 of 25 positions shown; findings below may reference images not displayed]

FINDINGS: Gallbladder:  No gallstones, gallbladder wall thickening, or
pericholecystic fluid.

Common bile duct:  Normal at 4 mm

Liver:  No focal lesion identified.  Within normal limits in
parenchymal echogenicity.

IVC:  Appears normal.

Pancreas:  No focal abnormality seen.

Spleen:  Normal size and echogenicity.

Right Kidney:  10.8cm in length.  No evidence of hydronephrosis or
stones.

Left Kidney:  10.3cm in length.  No evidence of hydronephrosis or
stones.

Abdominal aorta:  No aneurysm identified.
IMPRESSION: Normal abdominal ultrasound.

## 2013-12-23 ENCOUNTER — Emergency Department (HOSPITAL_COMMUNITY)
Admission: EM | Admit: 2013-12-23 | Discharge: 2013-12-23 | Disposition: A | Discharge status: Home / Self Care | Payer: BC Managed Care – PPO | Attending: Emergency Medicine | Admitting: Emergency Medicine

## 2013-12-23 DIAGNOSIS — N730 Acute parametritis and pelvic cellulitis: Secondary | ICD-10-CM

## 2013-12-23 DIAGNOSIS — F411 Generalized anxiety disorder: Secondary | ICD-10-CM | POA: Insufficient documentation

## 2013-12-23 DIAGNOSIS — N760 Acute vaginitis: Secondary | ICD-10-CM | POA: Insufficient documentation

## 2013-12-23 DIAGNOSIS — B9689 Other specified bacterial agents as the cause of diseases classified elsewhere: Secondary | ICD-10-CM

## 2013-12-23 DIAGNOSIS — N739 Female pelvic inflammatory disease, unspecified: Secondary | ICD-10-CM | POA: Insufficient documentation

## 2013-12-23 DIAGNOSIS — Z3202 Encounter for pregnancy test, result negative: Secondary | ICD-10-CM | POA: Insufficient documentation

## 2013-12-23 DIAGNOSIS — Z792 Long term (current) use of antibiotics: Secondary | ICD-10-CM | POA: Insufficient documentation

## 2013-12-23 LAB — URINE MICROSCOPIC-ADD ON

## 2013-12-23 LAB — URINALYSIS, ROUTINE W REFLEX MICROSCOPIC
Bilirubin Urine: NEGATIVE
GLUCOSE, UA: NEGATIVE mg/dL
HGB URINE DIPSTICK: NEGATIVE
Ketones, ur: NEGATIVE mg/dL
Nitrite: NEGATIVE
PH: 6.5 (ref 5.0–8.0)
PROTEIN: NEGATIVE mg/dL
SPECIFIC GRAVITY, URINE: 1.027 (ref 1.005–1.030)
Urobilinogen, UA: 0.2 mg/dL (ref 0.0–1.0)

## 2013-12-23 LAB — WET PREP, GENITAL
TRICH WET PREP: NONE SEEN
Yeast Wet Prep HPF POC: NONE SEEN

## 2013-12-23 LAB — POC URINE PREG, ED: PREG TEST UR: NEGATIVE

## 2013-12-23 MED ORDER — ONDANSETRON HCL 4 MG/2ML IJ SOLN
4.0000 mg | Freq: Once | INTRAMUSCULAR | Status: AC
  Administered 2013-12-23: 4 mg via INTRAVENOUS
  Filled 2013-12-23: qty 2

## 2013-12-23 MED ORDER — MORPHINE SULFATE 4 MG/ML IJ SOLN
4.0000 mg | Freq: Once | INTRAMUSCULAR | Status: AC
  Administered 2013-12-23: 4 mg via INTRAVENOUS
  Filled 2013-12-23: qty 1

## 2013-12-23 MED ORDER — ONDANSETRON 8 MG PO TBDP: 8.0000 mg | ORAL_TABLET | Freq: Three times a day (TID) | ORAL | Status: DC | PRN

## 2013-12-23 MED ORDER — TRAMADOL HCL 50 MG PO TABS: 50.0000 mg | ORAL_TABLET | Freq: Four times a day (QID) | ORAL | Status: DC | PRN

## 2013-12-23 MED ORDER — DOXYCYCLINE HYCLATE 100 MG PO CAPS: 100.0000 mg | ORAL_CAPSULE | Freq: Two times a day (BID) | ORAL | Status: DC

## 2013-12-23 MED ORDER — METRONIDAZOLE 500 MG PO TABS: 500.0000 mg | ORAL_TABLET | Freq: Two times a day (BID) | ORAL | Status: DC

## 2013-12-23 MED ORDER — SODIUM CHLORIDE 0.9 % IV BOLUS (SEPSIS)
1000.0000 mL | Freq: Once | INTRAVENOUS | Status: AC
  Administered 2013-12-23: 1000 mL via INTRAVENOUS

## 2013-12-23 MED ORDER — METRONIDAZOLE 500 MG PO TABS
2000.0000 mg | ORAL_TABLET | Freq: Once | ORAL | Status: AC
  Administered 2013-12-23: 2000 mg via ORAL
  Filled 2013-12-23: qty 4

## 2013-12-23 MED ORDER — AZITHROMYCIN 250 MG PO TABS
1000.0000 mg | ORAL_TABLET | Freq: Once | ORAL | Status: AC
  Administered 2013-12-23: 1000 mg via ORAL
  Filled 2013-12-23: qty 4

## 2013-12-23 MED ORDER — LIDOCAINE HCL 1 % IJ SOLN
INTRAMUSCULAR | Status: AC
  Administered 2013-12-23: 20 mL
  Filled 2013-12-23: qty 20

## 2013-12-23 MED ORDER — CEFTRIAXONE SODIUM 250 MG IJ SOLR
250.0000 mg | Freq: Once | INTRAMUSCULAR | Status: AC
  Administered 2013-12-23: 250 mg via INTRAMUSCULAR
  Filled 2013-12-23: qty 250

## 2013-12-23 NOTE — ED Notes (Signed)
Patient was seen by primary care doctor last Tuesday and was treated for UTI. Patient states that the pain has is worst in her pelvic area. Patient denies flank pain at this time. Pt denies N/V.

## 2013-12-23 NOTE — ED Notes (Signed)
She states she has been on Cipro for u.t.i. Since this Tues.  She is here today with c/o significant vaginal and lower pelvic area pain--seen at Urgent Care, who recommended she come here to be checked.

## 2013-12-23 NOTE — Discharge Instructions (Signed)
Please follow up with your primary care doctor at your already scheduled appointment on 12/26/13. Please continue taking your Cipro as prescribed for your UTI. You will be going home with Zofran as needed for nausea, Ultram as needed for pain, Doxycycline 100mg  twice daily for 14 days, and Metronidazole 500mg  twice daily for 14 days. Please avoid prolonged exposure to the sun, and do not drink ANY alcohol while taking these medications. The two antibiotics will treat both PID and your bacterial vaginosis.  Bacterial Vaginosis Bacterial vaginosis is a vaginal infection that occurs when the normal balance of bacteria in the vagina is disrupted. It results from an overgrowth of certain bacteria. This is the most common vaginal infection in women of childbearing age. Treatment is important to prevent complications, especially in pregnant women, as it can cause a premature delivery. CAUSES  Bacterial vaginosis is caused by an increase in harmful bacteria that are normally present in smaller amounts in the vagina. Several different kinds of bacteria can cause bacterial vaginosis. However, the reason that the condition develops is not fully understood. RISK FACTORS Certain activities or behaviors can put you at an increased risk of developing bacterial vaginosis, including:  Having a new sex partner or multiple sex partners.  Douching.  Using an intrauterine device (IUD) for contraception. Women do not get bacterial vaginosis from toilet seats, bedding, swimming pools, or contact with objects around them. SIGNS AND SYMPTOMS  Some women with bacterial vaginosis have no signs or symptoms. Common symptoms include:  Grey vaginal discharge.  A fishlike odor with discharge, especially after sexual intercourse.  Itching or burning of the vagina and vulva.  Burning or pain with urination. DIAGNOSIS  Your health care provider will take a medical history and examine the vagina for signs of bacterial  vaginosis. A sample of vaginal fluid may be taken. Your health care provider will look at this sample under a microscope to check for bacteria and abnormal cells. A vaginal pH test may also be done.  TREATMENT  Bacterial vaginosis may be treated with antibiotic medicines. These may be given in the form of a pill or a vaginal cream. A second round of antibiotics may be prescribed if the condition comes back after treatment.  HOME CARE INSTRUCTIONS   Only take over-the-counter or prescription medicines as directed by your health care provider.  If antibiotic medicine was prescribed, take it as directed. Make sure you finish it even if you start to feel better.  Do not have sex until treatment is completed.  Tell all sexual partners that you have a vaginal infection. They should see their health care provider and be treated if they have problems, such as a mild rash or itching.  Practice safe sex by using condoms and only having one sex partner. SEEK MEDICAL CARE IF:   Your symptoms are not improving after 3 days of treatment.  You have increased discharge or pain.  You have a fever. MAKE SURE YOU:   Understand these instructions.  Will watch your condition.  Will get help right away if you are not doing well or get worse. FOR MORE INFORMATION  Centers for Disease Control and Prevention, Division of STD Prevention: [employer]raud[employer] American Sexual Health Association (ASHA): www.ashastd.org  Document Released: 06/29/2005 Document Revised: 04/19/2013 Document Reviewed: 02/08/2013 [facility] Patient Information 2014 [employer].

## 2013-12-23 NOTE — ED Provider Notes (Signed)
CSN: [id number]     Arrival date & time 12/23/13  1330 History   First MD Initiated Contact with Patient 12/23/13 1548     Chief Complaint  Patient presents with  . Pelvic Pain     (Consider location/radiation/quality/duration/timing/severity/associated sxs/prior Treatment) HPI Comments: Lenora Boys is a 23y/o female with no significant PMHx who presents to the ED today complaining of pelvic pain. She states her "UTI symptoms" started 2 weeks ago, and she went to her OBGYN on Tuesday 12/19/13 where she was diagnosed with a UTI and started on Cipro 500mg  BID. She has been taking these as prescribed for 4 days and has noticed a progressive increase in her pain. She reports that she's been taking Ibuprofen 1000mg  with no relief. No known aggravating factors. Endorses vaginal discharge, which she states is white and cloudy. Denies fevers/chills, N/V/D, upper abd pain, back pain, melena or hematochezia, hematuria, dysuria, rash, CP, SOB, HA, rhinorrhea, or myalgias. No recent sexual contact, last partner was 3-7months ago. Was not tested for any STIs, and has no hx of STIs. Along with the UTI, she was also diagnosed with BV and a yeast infection, but has not started treatment for those, per her OBGYN recommendations. Denies smoking, drinking, or using illicit drugs.  Patient is a 23 y.o. female presenting with pelvic pain. The history is provided by the patient and a parent.  Pelvic Pain This is a new problem. The current episode started 1 to 4 weeks ago. The problem occurs constantly. The problem has been gradually worsening. Associated symptoms include abdominal pain (pelvic). Pertinent negatives include no anorexia, arthralgias, change in bowel habit, chest pain, chills, coughing, diaphoresis, fever, headaches, myalgias, nausea, numbness, rash, sore throat or vomiting. Nothing aggravates the symptoms. She has tried NSAIDs for the symptoms. The treatment provided no relief.    Past Medical History   Diagnosis Date  . Anxiety   . ADHD (attention deficit hyperactivity disorder)    Past Surgical History  Procedure Laterality Date  . Fractured arm Left     bones reset   Family History  Problem Relation Age of Onset  . Emphysema Other     maternal great grandmother  . Heart disease Maternal Grandmother   . Heart attack Maternal Uncle   . Cancer Mother     ovarian?  . Cancer Sister     pre-cancer cells in female organs  . Breast cancer Paternal Grandmother   . Colon polyps Mother   . Colon polyps Maternal Grandmother   . Diabetes Maternal Grandfather   . Heart disease Maternal Grandfather   . Irritable bowel syndrome Mother   . Irritable bowel syndrome Maternal Grandmother   . Kidney disease Maternal Grandfather    History  Substance Use Topics  . Smoking status: Never Smoker   . Smokeless tobacco: Never Used  . Alcohol Use: Yes     Comment: 3 drinks once a week   OB History   Grav Para Term Preterm Abortions TAB SAB Ect Mult Living                 Review of Systems  Constitutional: Negative for fever, chills, diaphoresis and appetite change.  HENT: Negative for ear pain, rhinorrhea and sore throat.   Eyes: Negative for pain.  Respiratory: Negative for cough and shortness of breath.   Cardiovascular: Negative for chest pain.  Gastrointestinal: Positive for abdominal pain (pelvic) and rectal pain (pressure/fullness). Negative for nausea, vomiting, diarrhea, constipation, blood in stool, abdominal distention,  anorexia and change in bowel habit.  Genitourinary: Positive for vaginal discharge, vaginal pain and pelvic pain. Negative for dysuria, hematuria, flank pain, decreased urine volume, vaginal bleeding, difficulty urinating and genital sores.  Musculoskeletal: Negative for arthralgias and myalgias.  Skin: Negative for rash.  Neurological: Negative for numbness and headaches.  All other systems reviewed and are negative.     Allergies  Review of patient's  allergies indicates no known allergies.  Home Medications   Prior to Admission medications   Medication Sig Start Date End Date Taking? Authorizing Provider  ciprofloxacin (CIPRO) 500 MG tablet Take 500 mg by mouth 2 (two) times daily.   Yes Historical Provider, MD  ibuprofen (ADVIL,MOTRIN) 200 MG tablet Take 800 mg by mouth every 6 (six) hours as needed (pain).   Yes Historical Provider, MD  doxycycline (VIBRAMYCIN) 100 MG capsule Take 1 capsule (100 mg total) by mouth 2 (two) times daily. 12/23/13   Colisha Redler Strupp Camprubi-Soms, PA-C  metroNIDAZOLE (FLAGYL) 500 MG tablet Take 1 tablet (500 mg total) by mouth 2 (two) times daily. 12/23/13   Deforest Maiden Strupp Camprubi-Soms, PA-C  ondansetron (ZOFRAN ODT) 8 MG disintegrating tablet Take 1 tablet (8 mg total) by mouth every 8 (eight) hours as needed for nausea or vomiting. 12/23/13   Patty Sermons Camprubi-Soms, PA-C  traMADol (ULTRAM) 50 MG tablet Take 1 tablet (50 mg total) by mouth every 6 (six) hours as needed for moderate pain. 12/23/13   Elandra Powell Strupp Camprubi-Soms, PA-C   BP 135/91  Pulse 77  Temp(Src) 98.9 F (37.2 C) (Oral)  Resp 18  SpO2 100%  LMP 11/26/2013 Physical Exam  Nursing note and vitals reviewed. Constitutional: She is oriented to person, place, and time. Vital signs are normal. She appears well-developed and well-nourished.  Appears uncomfortable  HENT:  Head: Normocephalic and atraumatic.  Mouth/Throat: Oropharynx is clear and moist and mucous membranes are normal.  MMM and intact   Eyes: Conjunctivae and EOM are normal. Pupils are equal, round, and reactive to light.  Neck: Normal range of motion. Neck supple.  Cardiovascular: Normal rate, regular rhythm, normal heart sounds and intact distal pulses.   Pulmonary/Chest: Effort normal and breath sounds normal. She has no wheezes.  Abdominal: Soft. Normal appearance and bowel sounds are normal. She exhibits no distension. There is no tenderness. There is no rigidity,  no rebound, no guarding, no CVA tenderness and no tenderness at McBurney's point.  Abdomen non-tender to light and deep palpation throughout. Bowel sounds WNL and equal throughout. No CVA tenderness.  Genitourinary: There is no rash or lesion on the right labia. There is no rash or lesion on the left labia. Uterus is not enlarged and not tender. Cervix exhibits motion tenderness and discharge. Right adnexum displays no mass and no tenderness. Left adnexum displays no mass and no tenderness. Vaginal discharge (mucoid) found.  External vagina free of rashes or lesions. Profuse mucoid d/c present within vaginal vault, cervix erythematous and with discharge at os. +CMT, no adnexal masses or tenderness.  Musculoskeletal: Normal range of motion.  Neurological: She is alert and oriented to person, place, and time.  Skin: Skin is warm, dry and intact. No rash noted.  Psychiatric: She has a normal mood and affect.    ED Course  Procedures (including critical care time) Labs Review Labs Reviewed  WET PREP, GENITAL - Abnormal; Notable for the following:    Clue Cells Wet Prep HPF POC MANY (*)    WBC, Wet Prep HPF POC MANY (*)  All other components within normal limits  URINALYSIS, ROUTINE W REFLEX MICROSCOPIC - Abnormal; Notable for the following:    Leukocytes, UA SMALL (*)    All other components within normal limits  URINE MICROSCOPIC-ADD ON - Abnormal; Notable for the following:    Squamous Epithelial / LPF FEW (*)    All other components within normal limits  GC/CHLAMYDIA PROBE AMP  RPR  HIV ANTIBODY (ROUTINE TESTING)  POC URINE PREG, ED    Imaging Review No results found.   EKG Interpretation None      MDM   Final diagnoses:  BV (bacterial vaginosis)  PID (acute pelvic inflammatory disease)    Tyonna E Sexson is a 23y/o female recently diagnosed with a UTI, BV, and yeast infection, started on Cipro 500mg  BID 4 days ago, who has had worsening pelvic pain. Will give fluid  bolus, Morphine, obtain U/A and Upreg, and perform pelvic exam. DDx includes but not limited to: UTI, vaginal infection, PID, ectopic pregnancy, and ovarian torsion.  5:54 PM Upreg negative, therefore no ectopic pregnancy risk. U/A unremarkable aside from small leuks, UTI seems to be clearing with Cipro. Pelvic exam concerning for PID given CMT and amount of discharge. Wet prep notable for clue cells, therefore BV likely as well. Will tx for PID with azithro/rocephin/flagyl and d/c on doxy and flagyl. Advised pt to continue cipro for UTI, since it appears to be working. No yeast present, will tell pt to not fill rx given by PCP. Pt would like STD check including RPR and HIV, therefore will obtain those today. Advised pt that these will not return today. Pt to f/up with PCP at already scheduled appt next week. D/c with pain meds and zofran.  6:25 PM Pt with increased pain. Will give one more dose of morphine prior to d/c.   Patty Sermons [place], [place] 12/23/13 1836

## 2013-12-24 LAB — RPR

## 2013-12-24 LAB — HIV ANTIBODY (ROUTINE TESTING W REFLEX): HIV 1&2 Ab, 4th Generation: NONREACTIVE

## 2013-12-24 NOTE — ED Provider Notes (Signed)
Medical screening examination/treatment/procedure(s) were conducted as a shared visit with non-physician practitioner(s) and myself.  I personally evaluated the patient during the encounter.   EKG Interpretation None      I interviewed and examined the patient. Lungs are CTAB. Cardiac exam wnl. Abdomen soft. Pt c/o vaginal pain. PA noting copious d/c and CMT on pelvic. No reported hx of STD's, but will tx empirically and send home on doxy to cover for PID. Will also tx for BV. Pain does not lateralize, doubt TOA or torsion.   Blanchard Kelch, MD 12/24/13 1050

## 2013-12-25 LAB — GC/CHLAMYDIA PROBE AMP
CT PROBE, AMP APTIMA: NEGATIVE
GC PROBE AMP APTIMA: NEGATIVE

## 2014-01-09 ENCOUNTER — Telehealth (HOSPITAL_BASED_OUTPATIENT_CLINIC_OR_DEPARTMENT_OTHER): Payer: Self-pay | Admitting: Emergency Medicine

## 2014-01-30 ENCOUNTER — Encounter (INDEPENDENT_AMBULATORY_CARE_PROVIDER_SITE_OTHER): Payer: Self-pay | Admitting: Surgery

## 2014-02-08 ENCOUNTER — Encounter: Payer: Self-pay | Admitting: *Deleted

## 2014-02-08 ENCOUNTER — Telehealth: Payer: Self-pay | Admitting: Gastroenterology

## 2014-02-08 NOTE — Telephone Encounter (Signed)
Spoke with Kellie Shropshire and scheduled patient on 02/13/14 at 2:30 PM with Alonza Bogus, PA.

## 2014-02-09 ENCOUNTER — Ambulatory Visit (INDEPENDENT_AMBULATORY_CARE_PROVIDER_SITE_OTHER): Payer: BC Managed Care – PPO | Admitting: Surgery

## 2014-02-13 ENCOUNTER — Encounter: Payer: Self-pay | Admitting: Gastroenterology

## 2014-02-13 ENCOUNTER — Ambulatory Visit (INDEPENDENT_AMBULATORY_CARE_PROVIDER_SITE_OTHER): Payer: BC Managed Care – PPO | Admitting: Gastroenterology

## 2014-02-13 VITALS — BP 88/62 | HR 52 | Ht 65.0 in | Wt 119.8 lb

## 2014-02-13 DIAGNOSIS — R197 Diarrhea, unspecified: Secondary | ICD-10-CM | POA: Insufficient documentation

## 2014-02-13 DIAGNOSIS — R102 Pelvic and perineal pain: Secondary | ICD-10-CM

## 2014-02-13 DIAGNOSIS — N949 Unspecified condition associated with female genital organs and menstrual cycle: Secondary | ICD-10-CM

## 2014-02-13 DIAGNOSIS — K589 Irritable bowel syndrome without diarrhea: Secondary | ICD-10-CM

## 2014-02-13 NOTE — Patient Instructions (Signed)
Please call if diarrhea gets worse to get stool studies done. You can call Rollene Fare., RN., [place]

## 2014-02-13 NOTE — Progress Notes (Signed)
Agree with initial assessment and plans 

## 2014-02-13 NOTE — Progress Notes (Signed)
     02/13/2014 Nicole Decker [id number] 11/04/1990   History of Present Illness:  This is a pleasant 23 year old female who was previously known to Dr. Sharlett Iles and underwent extensive GI evaluation within the past 12 months.  Colonoscopy 10/2012 was normal as well as EGD. Duodenal biopsies are negative for celiac disease and the celiac labs were negative as well. Abdominal ultrasound was normal. Sedimentation rate and TSH are both normal last year during evaluation as well. She was diagnosed likely to have IBS. She also thinks that she is lactose intolerant, but says that her favorite foods are ice cream and cheese, so she does still eat dairy products. She is here today at the request of her gynecologist with regards to complaints of pelvic pain. This pain has been present for approximately the past 2 months. She has undergone transvaginal ultrasounds and other evaluation from their standpoint. They're considering urologic evaluation versus some type of his hysteroscopy or laparoscopy, but sent her here for final evaluation prior to performing any procedures on her, however.  She also reports that she was having diarrhea for 2 weeks. She had recently restarted Paxil, but she discontinued that again and says that the diarrhea has been getting better. She had also been on several antibiotics to treat her pelvic pain, which could have been contributing to her diarrhea as well.  She was seeing some bright red blood with BM's but thinks it was from irritation from wiping and having so many BM's.   Current Medications, Allergies, Past Medical History, Past Surgical History, Family History and Social History were reviewed in [employer] record.   Physical Exam: BP 88/62  Pulse 52  Ht 5\' 5"  (1.651 m)  Wt 119 lb 12.8 oz (54.341 kg)  BMI 19.94 kg/m2 General: Well developed white female in no acute distress Head: Normocephalic and atraumatic Eyes:  Sclerae anicteric, conjunctiva  pink  Ears: Normal auditory acuity Lungs: Clear throughout to auscultation Heart: Bradycardic but regular rhythm Abdomen: Soft, non-distended.  Normal bowel sounds.  Mild suprapubic/lower abdominal/pelvic TTP without R/R/G. Musculoskeletal: Symmetrical with no gross deformities  Extremities: No edema  Neurological: Alert oriented x 4, grossly non-focal Psychological:  Alert and cooperative. Normal mood and affect  Assessment and Recommendations: -Pelvic pain:  Extensive GI evaluation negative within the past year.  The only other thing left to do would be a CT scan of the pelvis, which she is going to discuss with her GYN.   -Diarrhea:  Improving at this point, but has been on several antibiotics recently.  If diarrhea recurs/worsens or bowels fail to return to normal then could check stool for Cdiff, but she wants to hold off with that at this point.  She thinks diarrhea was a side effect of Paxil and could have been side effects of antibiotics as well. -History of IBS:  Symptoms intermittent.  Also clinically seems to have lactose intolerance.

## 2014-02-19 ENCOUNTER — Other Ambulatory Visit: Payer: Self-pay

## 2014-02-19 ENCOUNTER — Telehealth: Payer: Self-pay | Admitting: Gastroenterology

## 2014-02-19 DIAGNOSIS — R197 Diarrhea, unspecified: Secondary | ICD-10-CM

## 2014-02-19 NOTE — Telephone Encounter (Signed)
Pt called back and states she is still having a lot of diarrhea. Pt would like to get stool tested for cdiff as mentioned in OV note. Order entered in epic and Alonza Bogus PA aware.

## 2014-02-20 ENCOUNTER — Other Ambulatory Visit: Payer: BC Managed Care – PPO

## 2014-02-21 ENCOUNTER — Other Ambulatory Visit: Payer: BC Managed Care – PPO

## 2014-02-21 DIAGNOSIS — R197 Diarrhea, unspecified: Secondary | ICD-10-CM

## 2014-02-23 ENCOUNTER — Other Ambulatory Visit: Payer: Self-pay

## 2014-02-23 LAB — CLOSTRIDIUM DIFFICILE BY PCR: Toxigenic C. Difficile by PCR: DETECTED — CR

## 2014-02-23 MED ORDER — METRONIDAZOLE 500 MG PO TABS: 500.0000 mg | ORAL_TABLET | Freq: Three times a day (TID) | ORAL | Status: DC

## 2014-02-26 ENCOUNTER — Encounter: Payer: Self-pay | Admitting: Gynecology

## 2014-05-11 ENCOUNTER — Encounter: Payer: BC Managed Care – PPO | Admitting: Gastroenterology

## 2014-09-18 ENCOUNTER — Encounter: Payer: Self-pay | Admitting: Neurology

## 2014-09-18 ENCOUNTER — Ambulatory Visit (INDEPENDENT_AMBULATORY_CARE_PROVIDER_SITE_OTHER): Payer: BLUE CROSS/BLUE SHIELD | Admitting: Neurology

## 2014-09-18 VITALS — BP 128/76 | HR 85 | Resp 12 | Ht 66.75 in | Wt 119.2 lb

## 2014-09-18 DIAGNOSIS — G4710 Hypersomnia, unspecified: Secondary | ICD-10-CM | POA: Insufficient documentation

## 2014-09-18 DIAGNOSIS — G4753 Recurrent isolated sleep paralysis: Secondary | ICD-10-CM | POA: Diagnosis not present

## 2014-09-18 MED ORDER — MODAFINIL 200 MG PO TABS: 200.0000 mg | ORAL_TABLET | Freq: Every day | ORAL | Status: DC
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
## 2014-09-18 NOTE — Patient Instructions (Signed)
Narcolepsy Narcolepsy is a nervous system disorder that causes daytime sleepiness and sudden bouts of irresistible sleep during the day (sleep attacks). Many people with narcolepsy live with extreme daytime sleepiness for many years before being diagnosed and treated. Narcolepsy is a lifelong (chronic) disorder.  You normally go through cycles when you sleep. When your sleep becomes deeper, you have less body movement, and you start dreaming. This type of deep sleep should happen after about 90 minutes of lighter sleep. Deep sleep is called rapid eye movement (REM) sleep. When you have narcolepsy, REM is not well regulated. It can happen as soon as you fall asleep, and components of REM sleep can occur during the day when you are awake. Uncontrolled REM sleep causes symptoms of narcolepsy. CAUSES Narcolepsy may be caused by an abnormality with a chemical messenger (neurotransmitter) in your brain that controls your sleep and wake cycles. Most people with narcolepsy have low levels of the neurotransmitter hypocretin. Hypocretin is important for controlling wakefulness.  A hypocretin imbalance may be caused by abnormal genes that are passed down through families. It may also develop if the body's defense system (immune system) mistakenly attacks the brain cells that produce hypocretin (autoimmune disease). RISK FACTORS You may be at higher risk for narcolepsy if you have a family history of the disease. Other risk factors that may contribute include:  Injuries, tumors, or infections in the areas of the brain that control sleep.  Exposure to toxins.  Stress.  Hormones produced during puberty and menopause.  Poor sleep habits. SIGNS AND SYMPTOMS  Symptoms of narcolepsy can start at any age but usually begin when people are between the ages of 7 and 25 years. There are four major symptoms. Not everyone with narcolepsy will have all four.   Excessive daytime sleepiness. This is the most common symptom  and is usually the first symptom you will notice.  You may feel as if you are in a mental fog.  Daytime sleepiness may severely affect your performance at work or school.  You may fall asleep during a conversation or while eating dinner.  Sudden loss of muscle tone (cataplexy). You do not lose consciousness, but you may suddenly lose muscle control. When this occurs, your speech may become slurred, or your knees may buckle. This symptom is usually triggered by surprise, anger, or laughter.  Sleep paralysis. You may lose the ability to speak or move just as you start to fall asleep or wake up. You will be aware of the paralysis. It usually lasts for just a few seconds or minutes.  Vivid hallucinations. These may occur with sleep paralysis. The hallucinations are like having bizarre or frightening dreams while you are still awake. Other symptoms may include:   Trouble staying asleep at night (insomnia).  Restless sleep.  Feeling a strong urge to get up at night to smoke or eat. DIAGNOSIS  Your health care provider can diagnose narcolepsy based on your symptoms and the results of two diagnostic tests. You may also be asked to keep a sleep diary for several weeks. You may need the tests if you have had daytime sleepiness for at least 3 months. The two tests are:   A polysomnogram to find out how well your REM sleep is regulated at night. This test is an overnight sleep study. It measures your heart rate, breathing, movement, and brain waves.  A multiple sleep latency test (MSLT) to find out how well your REM sleep is regulated during the day. This   is a daytime sleep study. You may need to take several naps during the day. This test also measures your heart rate, breathing, movement, and brain waves. TREATMENT  There is no cure for narcolepsy, but treatment can be very effective in helping manage the condition. Treatment may include:  Lifestyle and sleeping strategies to help cope with the  condition.  Medicines. These may include:  Stimulant medicines to fight daytime sleepiness.  Antidepressant medicines to treat cataplexy.  Sodium oxybate. This is a strong sedative that you take at night. It can help daytime sleepiness and cataplexy. HOME CARE INSTRUCTIONS  Take all medicines as directed by your health care provider.  Follow these sleep practices:  Try to get about 8 hours of sleep every night.  Go to sleep and get up close to the same time every day.  Keep your bedroom dark, quiet, and comfortable.  Schedule short naps for when you feel sleepiest during the day. Tell your employer or teachers that you have narcolepsy. You may be able to adjust your schedule to include time for naps.  Try to get at least 20 minutes of exercise every day. This will help you sleep better at night and reduce daytime sleepiness.  Do not drink alcohol or caffeinated beverages within 4-5 hours of bedtime.  Do not eat a heavy meal before bedtime. Eat at about the same times every day.  Do not smoke.  Do not drive if you are sleepy or have untreated narcolepsy.  Do not swim or go out on the water without a life jacket. SEEK MEDICAL CARE IF: Your medicines are not controlling your narcolepsy symptoms. SEEK IMMEDIATE MEDICAL CARE IF:  You hurt yourself during a sleep attack or an attack of cataplexy.  You have chest pain or trouble breathing. Document Released: 06/19/2002 Document Revised: 11/13/2013 Document Reviewed: 06/21/2013 [facility] Patient Information 2015 [place], [place]. This information is not intended to replace advice given to you by your health care provider. Make sure you discuss any questions you have with your health care provider. Modafinil tablets What is this medicine? MODAFINIL (moe DAF i nil) is used to treat excessive sleepiness caused by certain sleep disorders. This includes narcolepsy, sleep apnea, and shift work sleep disorder. This medicine may be used  for other purposes; ask your health care provider or pharmacist if you have questions. COMMON BRAND NAME(S): Provigil What should I tell my health care provider before I take this medicine? They need to know if you have any of these conditions: -history of depression, mania, or other mental disorder -kidney disease -liver disease -an unusual or allergic reaction to modafinil, other medicines, foods, dyes, or preservatives -pregnant or trying to get pregnant -breast-feeding How should I use this medicine? Take this medicine by mouth with a glass of water. Follow the directions on the prescription label. Take your doses at regular intervals. Do not take your medicine more often than directed. Do not stop taking except on your doctor's advice. A special MedGuide will be given to you by the pharmacist with each prescription and refill. Be sure to read this information carefully each time. Talk to your pediatrician regarding the use of this medicine in children. This medicine is not approved for use in children. Overdosage: If you think you have taken too much of this medicine contact a poison control center or emergency room at once. NOTE: This medicine is only for you. Do not share this medicine with others. What if I miss a dose? If you  miss a dose, take it as soon as you can. If it is almost time for your next dose, take only that dose. Do not take double or extra doses. What may interact with this medicine? Do not take this medicine with any of the following medications: -amphetamine or dextroamphetamine -dexmethylphenidate or methylphenidate -medicines called MAO Inhibitors like Nardil, Parnate, Marplan, Eldepryl -pemoline -procarbazine This medicine may also interact with the following medications: -antifungal medicines like itraconazole or ketoconazole -barbiturates like phenobarbital -birth control pills or other hormone-containing birth control devices or  implants -carbamazepine -cyclosporine -diazepam -medicines for depression, anxiety, or psychotic disturbances -phenytoin -propranolol -triazolam -warfarin This list may not describe all possible interactions. Give your health care provider a list of all the medicines, herbs, non-prescription drugs, or dietary supplements you use. Also tell them if you smoke, drink alcohol, or use illegal drugs. Some items may interact with your medicine. What should I watch for while using this medicine? Visit your doctor or health care professional for regular checks on your progress. The full effects of this medicine may not be seen right away. This medicine may affect your concentration, function, or may hide signs that you are tired. You may get dizzy. Do not drive, use machinery, or do anything that needs mental alertness until you know how this drug affects you. Alcohol can make you more dizzy and may interfere with your response to this medicine or your alertness. Avoid alcoholic drinks. Birth control pills may not work properly while you are taking this medicine. Talk to your doctor about using an extra method of birth control. It is unknown if the effects of this medicine will be increased by the use of caffeine. Caffeine is available in many foods, beverages, and medications. Ask your doctor if you should limit or change your intake of caffeine-containing products while on this medicine. What side effects may I notice from receiving this medicine? Side effects that you should report to your doctor or health care professional as soon as possible: -allergic reactions like skin rash, itching or hives, swelling of the face, lips, or tongue -anxiety -breathing problems -chest pain -fast, irregular heartbeat -hallucinations -increased blood pressure -redness, blistering, peeling or loosening of the skin, including inside the mouth -sore throat, fever, or chills -suicidal thoughts or other mood  changes -tremors -vomiting Side effects that usually do not require medical attention (report to your doctor or health care professional if they continue or are bothersome): -headache -nausea, diarrhea, or stomach upset -nervousness -trouble sleeping This list may not describe all possible side effects. Call your doctor for medical advice about side effects. You may report side effects to FDA at 1-800-FDA-1088. Where should I keep my medicine? Keep out of the reach of children. This medicine can be abused. Keep your medicine in a safe place to protect it from theft. Do not share this medicine with anyone. Selling or giving away this medicine is dangerous and against the law. Store at room temperature between 20 and 25 degrees C (68 and 77 degrees F). Throw away any unused medicine after the expiration date. NOTE: This sheet is a summary. It may not cover all possible information. If you have questions about this medicine, talk to your doctor, pharmacist, or health care provider.  2015, [employer]. (2009-06-11 21:07:42)

## 2014-09-25 ENCOUNTER — Telehealth: Payer: Self-pay | Admitting: *Deleted

## 2014-09-25 NOTE — Telephone Encounter (Signed)
I called pt and relayed the negative HLA Narcolepsy results (normal).  She verbalized understanding.

## 2014-11-02 ENCOUNTER — Encounter: Payer: Self-pay | Admitting: Neurology

## 2014-11-22 ENCOUNTER — Ambulatory Visit: Payer: BLUE CROSS/BLUE SHIELD | Admitting: Neurology

## 2014-12-31 ENCOUNTER — Encounter: Payer: Self-pay | Admitting: Neurology

## 2014-12-31 ENCOUNTER — Ambulatory Visit (INDEPENDENT_AMBULATORY_CARE_PROVIDER_SITE_OTHER): Payer: BLUE CROSS/BLUE SHIELD | Admitting: Neurology

## 2014-12-31 VITALS — BP 120/80 | HR 76 | Resp 20 | Ht 66.54 in | Wt 122.0 lb

## 2014-12-31 DIAGNOSIS — G4710 Hypersomnia, unspecified: Secondary | ICD-10-CM

## 2014-12-31 DIAGNOSIS — G479 Sleep disorder, unspecified: Secondary | ICD-10-CM | POA: Insufficient documentation

## 2014-12-31 DIAGNOSIS — R413 Other amnesia: Secondary | ICD-10-CM | POA: Insufficient documentation

## 2014-12-31 DIAGNOSIS — G4753 Recurrent isolated sleep paralysis: Secondary | ICD-10-CM

## 2014-12-31 DIAGNOSIS — R5383 Other fatigue: Secondary | ICD-10-CM

## 2014-12-31 MED ORDER — MODAFINIL 200 MG PO TABS: 200.0000 mg | ORAL_TABLET | Freq: Every day | ORAL | Status: AC
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
## 2014-12-31 NOTE — Patient Instructions (Signed)
Hypersomnia Hypersomnia usually brings recurrent episodes of excessive daytime sleepiness or prolonged nighttime sleep. It is different than feeling tired due to lack of or interrupted sleep at night. People with hypersomnia are compelled to nap repeatedly during the day. This is often at inappropriate times such as:  At work.  During a meal.  In conversation. These daytime naps usually provide no relief. This disorder typically affects adolescents and young adults. CAUSES  This condition may be caused by:  Another sleep disorder (such as narcolepsy or sleep apnea).  Dysfunction of the autonomic nervous system.  Drug or alcohol abuse.  A physical problem, such as:  A tumor.  Head trauma. This is damage caused by an accident.  Injury to the central nervous system.  Certain medications, or medicine withdrawal.  Medical conditions may contribute to the disorder, including:  Multiple sclerosis.  Depression.  Encephalitis.  Epilepsy.  Obesity.  Some people appear to have a genetic predisposition to this disorder. In others, there is no known cause. SYMPTOMS   Patients often have difficulty waking from a long sleep. They may feel dazed or confused.  Other symptoms may include:  Anxiety.  Increased irritation (inflammation).  Decreased energy.  Restlessness.  Slow thinking.  Slow speech.  Loss of appetite.  Hallucinations.  Memory difficulty.  Tremors, Tics.  Some patients lose the ability to function in family, social, occupational, or other settings. TREATMENT  Treatment is symptomatic in nature. Stimulants and other drugs may be used to treat this disorder. Changes in behavior may help. For example, avoid night work and social activities that delay bed time. Changes in diet may offer some relief. Patients should avoid alcohol and caffeine. PROGNOSIS  The likely outcome (prognosis) for persons with hypersomnia depends on the cause of the disorder.  The disorder itself is not life threatening. But it can have serious consequences. For example, automobile accidents can be caused by falling asleep while driving. The attacks usually continue indefinitely. Document Released: 06/19/2002 Document Revised: 09/21/2011 Document Reviewed: 05/23/2008 [facility] Patient Information 2015 [place], [place]. This information is not intended to replace advice given to you by your health care provider. Make sure you discuss any questions you have with your health care provider.

## 2015-02-13 ENCOUNTER — Ambulatory Visit (INDEPENDENT_AMBULATORY_CARE_PROVIDER_SITE_OTHER): Payer: BLUE CROSS/BLUE SHIELD | Admitting: Neurology

## 2015-02-13 DIAGNOSIS — G4710 Hypersomnia, unspecified: Secondary | ICD-10-CM | POA: Diagnosis not present

## 2015-02-13 DIAGNOSIS — G4753 Recurrent isolated sleep paralysis: Secondary | ICD-10-CM

## 2015-02-13 NOTE — Sleep Study (Signed)
Please see the scanned sleep study interpretation located in the Procedure tab within the Chart Review section. 

## 2015-02-25 ENCOUNTER — Telehealth: Payer: Self-pay

## 2015-02-25 NOTE — Telephone Encounter (Signed)
Called to give pt sleep study results. No answer, left a message asking pt to call me back.

## 2015-03-05 ENCOUNTER — Telehealth: Payer: Self-pay

## 2015-03-05 NOTE — Telephone Encounter (Signed)
Called to give pt results of her sleep study. No answer, left a message asking pt to call me back.

## 2015-03-07 NOTE — Telephone Encounter (Signed)
Called pt a third time to discuss sleep study results. No answer, left a voicemail asking her to call me back. I will mail the patient a letter asking her to please call me to discuss results.

## 2015-03-22 ENCOUNTER — Other Ambulatory Visit: Payer: Self-pay | Admitting: Neurology

## 2015-03-22 NOTE — Telephone Encounter (Signed)
I called pharmacy and verified order verbally

## 2015-03-22 NOTE — Telephone Encounter (Signed)
[facility] [phone] called to request refill for modafinil (PROVIGIL) 200 MG tablet, original Rx was accidentally deleted when they were transferring Rx from store [phone].

## 2015-03-22 NOTE — Telephone Encounter (Signed)
6 month Rx was written in June

## 2015-09-19 ENCOUNTER — Other Ambulatory Visit: Payer: Self-pay | Admitting: Neurology

## 2015-10-03 ENCOUNTER — Other Ambulatory Visit: Payer: Self-pay | Admitting: Neurology
